# Patient Record
Sex: Male | Born: 1985 | Marital: Single | Smoking: Never smoker
Health system: Southern US, Community
[De-identification: ages and names within clinical notes are randomized; demographics above are authoritative.]

## PROBLEM LIST (undated history)

## (undated) DIAGNOSIS — Z789 Other specified health status: Secondary | ICD-10-CM

## (undated) HISTORY — DX: Other specified health status: Z78.9

## (undated) HISTORY — PX: NO PAST SURGERIES: SHX2092

## (undated) HISTORY — PX: WISDOM TOOTH EXTRACTION: SHX21

---

## 2014-02-07 ENCOUNTER — Encounter (INDEPENDENT_AMBULATORY_CARE_PROVIDER_SITE_OTHER): Payer: Self-pay

## 2014-02-07 ENCOUNTER — Encounter: Payer: Self-pay | Admitting: Family Medicine

## 2014-02-07 ENCOUNTER — Ambulatory Visit (INDEPENDENT_AMBULATORY_CARE_PROVIDER_SITE_OTHER): Payer: BC Managed Care – PPO | Admitting: Family Medicine

## 2014-02-07 VITALS — BP 127/73 | HR 80 | Ht 71.0 in | Wt 160.0 lb

## 2014-02-07 DIAGNOSIS — M542 Cervicalgia: Secondary | ICD-10-CM

## 2014-02-07 NOTE — Patient Instructions (Signed)
You have a cervical strain. Consider tylenol or aleve as needed for pain. Consider muscle relaxant. Simple range of motion exercises within limits of pain to prevent further stiffness. See handout for specific exercises you can do - hold stretches for 20-30 seconds when you do them. Consider physical therapy for stretching, exercises, traction, and modalities - call us if you want to do this at some point. Heat 15 minutes at a time 3-4 times a day to help with spasms. Watch head position when on computers, texting, when sleeping in bed - should in line with back to prevent further muscle strain/spasms. Consider home traction unit if you get benefit with this in physical therapy.  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 Hip side raises 3 x 10 once a day - add weights if this becomes too easy.  Stretches - pick 2 and hold for 20-30 seconds x 3 - do once or twice a day. Tylenol and/or aleve as needed for pain. If not improving, can consider formal physical therapy and/or steroid injection. Follow up with me in 1 month or as needed.

## 2014-02-08 ENCOUNTER — Encounter: Payer: Self-pay | Admitting: Family Medicine

## 2014-02-08 DIAGNOSIS — M542 Cervicalgia: Secondary | ICD-10-CM | POA: Insufficient documentation

## 2014-02-08 NOTE — Assessment & Plan Note (Signed)
2/2 cervical strain.  Reviewed home exercises to do daily.  Declined physical therapy for now - advised to call us if he would like to do this.  Consider tylenol, nsaids, muscle relaxant.  Heat for spasms.  Ergonomic issues discussed.  F/u in 1 month or prn.

## 2014-02-08 NOTE — Progress Notes (Signed)
PCP: No primary care provider on file.  Subjective:   HPI: Patient is a 28 y.o. male here for neck pain.  Patient reports about 2 months ago he woke up with pain in left side of neck. Has improved but then will worsen again. No radiation into extremities. No numbness or tingling. Worse when neck is flexed. No bowel/bladder dysfunction. Tried some stretches at home only.  No past medical history on file.  No current outpatient prescriptions on file prior to visit.   No current facility-administered medications on file prior to visit.    No past surgical history on file.  No Known Allergies  History   Social History  . Marital Status: Single    Spouse Name: N/A    Number of Children: N/A  . Years of Education: N/A   Occupational History  . Not on file.   Social History Main Topics  . Smoking status: Never Smoker   . Smokeless tobacco: Not on file  . Alcohol Use: Not on file  . Drug Use: Not on file  . Sexual Activity: Not on file   Other Topics Concern  . Not on file   Social History Narrative  . No narrative on file    No family history on file.  BP 127/73 mmHg  Pulse 80  Ht 5\' 11"  (1.803 m)  Wt 160 lb (72.576 kg)  BMI 22.33 kg/m2  Review of Systems: See HPI above.    Objective:  Physical Exam:  Gen: NAD  Neck: No gross deformity, swelling, bruising. TTP left trapezius, cervical paraspinal region.  No midline/bony TTP. FROM neck - pain with bilateral lateral rotations in left paraspinal muscles. BUE strength 5/5.   Sensation intact to light touch.   2+ equal reflexes in triceps, biceps, brachioradialis tendons. Negative spurlings. NV intact distal BUEs.    Assessment & Plan:  1. Neck pain - 2/2 cervical strain.  Reviewed home exercises to do daily.  Declined physical therapy for now - advised to call us if he would like to do this.  Consider tylenol, nsaids, muscle relaxant.  Heat for spasms.  Ergonomic issues discussed.  F/u in 1 month or  prn.  Note: he also asked about lateral knee pain, pointed to distal IT band - worse with squatting.  Reviewed home exercises, stretches for this as well.

## 2017-04-24 DIAGNOSIS — M71572 Other bursitis, not elsewhere classified, left ankle and foot: Secondary | ICD-10-CM | POA: Diagnosis not present

## 2017-04-24 DIAGNOSIS — M722 Plantar fascial fibromatosis: Secondary | ICD-10-CM | POA: Diagnosis not present

## 2017-04-24 DIAGNOSIS — M7732 Calcaneal spur, left foot: Secondary | ICD-10-CM | POA: Diagnosis not present

## 2017-05-13 DIAGNOSIS — M722 Plantar fascial fibromatosis: Secondary | ICD-10-CM | POA: Diagnosis not present

## 2017-05-13 DIAGNOSIS — M71572 Other bursitis, not elsewhere classified, left ankle and foot: Secondary | ICD-10-CM | POA: Diagnosis not present

## 2018-04-22 DIAGNOSIS — G4484 Primary exertional headache: Secondary | ICD-10-CM | POA: Diagnosis not present

## 2018-04-22 DIAGNOSIS — Z0000 Encounter for general adult medical examination without abnormal findings: Secondary | ICD-10-CM | POA: Diagnosis not present

## 2018-04-22 DIAGNOSIS — E65 Localized adiposity: Secondary | ICD-10-CM | POA: Diagnosis not present

## 2018-04-22 DIAGNOSIS — M25511 Pain in right shoulder: Secondary | ICD-10-CM | POA: Diagnosis not present

## 2018-04-22 DIAGNOSIS — E663 Overweight: Secondary | ICD-10-CM | POA: Diagnosis not present

## 2018-06-09 ENCOUNTER — Ambulatory Visit: Payer: BLUE CROSS/BLUE SHIELD | Admitting: Family Medicine

## 2018-06-09 ENCOUNTER — Other Ambulatory Visit: Payer: Self-pay

## 2018-06-09 ENCOUNTER — Encounter: Payer: Self-pay | Admitting: Family Medicine

## 2018-06-09 VITALS — BP 129/82 | HR 69 | Ht 71.0 in | Wt 170.0 lb

## 2018-06-09 DIAGNOSIS — M25511 Pain in right shoulder: Secondary | ICD-10-CM

## 2018-06-09 DIAGNOSIS — G8929 Other chronic pain: Secondary | ICD-10-CM | POA: Diagnosis not present

## 2018-06-09 DIAGNOSIS — M25571 Pain in right ankle and joints of right foot: Secondary | ICD-10-CM | POA: Diagnosis not present

## 2018-06-09 NOTE — Patient Instructions (Addendum)
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 Ice the area 15 minutes at a time 3-4 times a day. Naproxen twice a day with food (when you run out of this you can take 2 aleve twice a day with food instead). Arch support is important (your orthotics) Avoid flat shoes, barefoot walking as much as possible.  I'm concerned you've at least bruised the labrum of your shoulder Try to avoid painful activities (overhead activities, spiking) as much as possible for the next 6 weeks. Naproxen for this as well. Do home exercise program with theraband and scapular stabilization exercises daily 3 sets of 10 once a day. If not improving at follow-up we will consider MR arthrogram Follow up with me in 6 weeks.

## 2018-06-09 NOTE — Progress Notes (Signed)
PCP: [facility]  Subjective:   HPI: Patient is a 33 y.o. male here for right foot, right shoulder pain.  Patient reports he's had about 4 days of right anterolateral ankle and lateral foot pain. No acute injury but restarted working out - ran 3 miles, next day exercised and played volleyball, woke up day after this with current pain. Associated swelling. Tried massage, epsom salt baths, naproxen. Some improvement though pain is 4-5/10 and sharp at worst. Also with about 6 months of posterolateral right shoulder pain. Pain 0/10 at rest but when spiking volleyball comes on to 8/10 level, sharp. No skin changes, numbness. No acute injury  History reviewed. No pertinent past medical history.  Current Outpatient Medications on File Prior to Visit  Medication Sig Dispense Refill  . naproxen (NAPROSYN) 500 MG tablet      No current facility-administered medications on file prior to visit.     Past Surgical History:  Procedure Laterality Date  . WISDOM TOOTH EXTRACTION      No Known Allergies  Social History   Socioeconomic History  . Marital status: Single    Spouse name: Not on file  . Number of children: Not on file  . Years of education: Not on file  . Highest education level: Not on file  Occupational History  . Not on file  Social Needs  . Financial resource strain: Not on file  . Food insecurity:    Worry: Not on file    Inability: Not on file  . Transportation needs:    Medical: Not on file    Non-medical: Not on file  Tobacco Use  . Smoking status: Never Smoker  . Smokeless tobacco: Never Used  Substance and Sexual Activity  . Alcohol use: Not on file  . Drug use: Not on file  . Sexual activity: Not on file  Lifestyle  . Physical activity:    Days per week: Not on file    Minutes per session: Not on file  . Stress: Not on file  Relationships  . Social connections:    Talks on phone: Not on file    Gets together: Not on  file    Attends religious service: Not on file    Active member of club or organization: Not on file    Attends meetings of clubs or organizations: Not on file    Relationship status: Not on file  . Intimate partner violence:    Fear of current or ex partner: Not on file    Emotionally abused: Not on file    Physically abused: Not on file    Forced sexual activity: Not on file  Other Topics Concern  . Not on file  Social History Narrative  . Not on file    History reviewed. No pertinent family history.  BP 129/82   Pulse 69   Ht 5\' 11"  (1.803 m)   Wt 170 lb (77.1 kg)   BMI 23.71 kg/m   Review of Systems: See HPI above.     Objective:  Physical Exam:  Gen: NAD, comfortable in exam room  Right foot/ankle: No gross deformity, swelling, ecchymoses FROM with 5/5 strength without pain. TTP sinus tarsi.  No other tenderness. Negative ant drawer and talar tilt.   Negative syndesmotic compression. Thompsons test negative. NV intact distally. Negative hop test.  Left foot/ankle: No deformity. FROM with 5/5 strength. No tenderness to palpation. NVI distally.  Right shoulder: No swelling, ecchymoses.  No gross deformity.  No TTP. FROM. Negative Hawkins, Neers. Negative Yergasons. Strength 5/5 with empty can and resisted internal/external rotation. Negative apprehension. Positive o'briens. NV intact distally.  Left shoulder: No swelling, ecchymoses.  No gross deformity. No TTP. FROM. Strength 5/5 with empty can and resisted internal/external rotation. NV intact distally.   Assessment & Plan:  1. Right foot/ankle pain - consistent with sinus tarsi syndrome.  Reassured patient.  Icing, naproxen, stressed importance of arch supports and no flat shoes/barefoot walking.  Activities as tolerated otherwise.  2. Right shoulder pain - concerning for labral pathology.  Has not had any rehab for this - reviewed home exercise program today.  Avoid spiking volleyball in  next 6 weeks.  Naproxen.  Consider MR arthrogram, physical therapy.  F/u in 6 weeks.

## 2018-09-13 ENCOUNTER — Ambulatory Visit (INDEPENDENT_AMBULATORY_CARE_PROVIDER_SITE_OTHER): Payer: BC Managed Care – PPO | Admitting: Sports Medicine

## 2018-09-13 ENCOUNTER — Encounter: Payer: Self-pay | Admitting: Sports Medicine

## 2018-09-13 ENCOUNTER — Ambulatory Visit (INDEPENDENT_AMBULATORY_CARE_PROVIDER_SITE_OTHER): Payer: BC Managed Care – PPO

## 2018-09-13 ENCOUNTER — Other Ambulatory Visit: Payer: Self-pay

## 2018-09-13 DIAGNOSIS — G8929 Other chronic pain: Secondary | ICD-10-CM

## 2018-09-13 DIAGNOSIS — M25511 Pain in right shoulder: Secondary | ICD-10-CM

## 2018-09-13 DIAGNOSIS — S43431A Superior glenoid labrum lesion of right shoulder, initial encounter: Secondary | ICD-10-CM | POA: Diagnosis not present

## 2018-09-13 MED ORDER — MELOXICAM 15 MG PO TABS: ORAL_TABLET | ORAL | 3 refills | Status: DC

## 2018-09-13 NOTE — Assessment & Plan Note (Addendum)
9 months of pain referrable to the labrum. Has failed greater than 6 weeks of physician directed physical therapy. X-rays today. Persistent discomfort, adding an MR arthrogram, he will return this afternoon at 4:15 for the arthrogram injection and he will have his MRI at 4:45.

## 2018-09-13 NOTE — Progress Notes (Signed)
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  I reviewed the past medical history, family history, social history, surgical history, and allergies today and no changes were needed.  Please see the problem list section below in epic for further details.  Past Medical History: Past Medical History:  Diagnosis Date  . No pertinent past medical history    Past Surgical History: Past Surgical History:  Procedure Laterality Date  . NO PAST SURGERIES    . WISDOM TOOTH EXTRACTION     Social History: Social History   Socioeconomic History  . Marital status: Single    Spouse name: Not on file  . Number of children: Not on file  . Years of education: Not on file  . Highest education level: Not on file  Occupational History  . Not on file  Social Needs  . Financial resource strain: Not on file  . Food insecurity    Worry: Not on file    Inability: Not on file  . Transportation needs    Medical: Not on file    Non-medical: Not on file  Tobacco Use  . Smoking status: Never Smoker  . Smokeless tobacco: Never Used  Substance and Sexual Activity  . Alcohol use: Yes    Alcohol/week: 0.0 standard drinks  . Drug use: Never  . Sexual activity: Not on file  Lifestyle  . Physical activity    Days per week: Not on file    Minutes per session: Not on file  . Stress: Not on file  Relationships  . Social Herbalist on phone: Not on file    Gets together: Not on file    Attends religious service: Not on file    Active member of club or organization: Not on file    Attends meetings of clubs or organizations: Not on file    Relationship status: Not on file  Other  Topics Concern  . Not on file  Social History Narrative  . Not on file   Family History: No family history on file. Allergies: No Known Allergies Medications: See med rec.  Review of Systems: No headache, visual changes, nausea, vomiting, diarrhea, constipation, dizziness, abdominal pain, skin rash, fevers, chills, night sweats, swollen lymph nodes, weight loss, chest pain, body aches, joint swelling, muscle aches, shortness of breath, mood changes, visual or auditory hallucinations.  Objective:    General: Well Developed, well nourished, and in no acute distress.  Neuro: Alert and oriented x3, extra-ocular muscles intact, sensation grossly intact.  HEENT: Normocephalic, atraumatic, pupils equal round reactive to light, neck supple, no masses, no lymphadenopathy, thyroid nonpalpable.  Skin: Warm and dry, no rashes noted.  Cardiac: Regular rate and rhythm, no murmurs rubs or gallops.  Respiratory: Clear to auscultation bilaterally. Not using accessory muscles, speaking in full sentences.  Abdominal: Soft, nontender, nondistended, positive bowel sounds, no masses, no organomegaly.  Right shoulder: Inspection reveals no abnormalities, atrophy or asymmetry. Palpation is normal with no tenderness over AC joint or bicipital groove. ROM is full in all planes. Rotator cuff strength normal throughout. No signs of impingement with negative Neer and Hawkin's tests, empty can. Speeds and Yergason's tests normal. Positive  Obrien's, negative crank, negative clunk, and good stability. Normal scapular function observed. No painful arc and no drop arm sign. No apprehension sign  Impression and Recommendations:    The patient was counselled, risk factors were discussed, anticipatory guidance given.  Right shoulder pain 9 months of pain referrable to the labrum. Has failed greater than 6 weeks of physician directed physical therapy. X-rays today. Persistent discomfort, adding an MR arthrogram,  he will return this afternoon at 4:15 for the arthrogram injection and he will have his MRI at 4:45.   ___________________________________________ Ihor Austinhomas J. Benjamin Stainhekkekandam, M.D., ABFM., CAQSM. Primary Care and Sports Medicine [facility]  Adjunct Professor of Family Medicine  [employer]

## 2018-09-13 NOTE — Progress Notes (Signed)
   Procedure: Real-time Ultrasound Guided gadolinium contrast injection of right glenohumeral joint Device: GE Logiq E  Verbal informed consent obtained.  Time-out conducted.  Noted no overlying erythema, induration, or other signs of local infection.  Skin prepped in a sterile fashion.  Local anesthesia: Topical Ethyl chloride.  With sterile technique and under real time ultrasound guidance: I guided a 22-gauge spinal needle into the glenohumeral joint from a posterior approach, I then injected 1 cc Kenalog 40, 2 cc lidocaine, 2 cc bupivacaine, syringe switched and 0.1 cc gadolinium injected, syringe again switched and 12 cc sterile saline used to flush the needle and distend the joint. Joint visualized and capsule seen distending confirming intra-articular placement of contrast material and medication. Completed without difficulty  Advised to call if fevers/chills, erythema, induration, drainage, or persistent bleeding.  Images permanently stored and available for review in the ultrasound unit.  Impression: Technically successful ultrasound guided gadolinium contrast injection for MR arthrography.  Please see separate MR arthrogram report.

## 2018-10-21 DIAGNOSIS — Z1159 Encounter for screening for other viral diseases: Secondary | ICD-10-CM | POA: Diagnosis not present

## 2018-10-25 DIAGNOSIS — U071 COVID-19: Secondary | ICD-10-CM | POA: Diagnosis not present

## 2019-03-10 ENCOUNTER — Ambulatory Visit: Payer: BC Managed Care – PPO | Attending: Internal Medicine

## 2019-03-10 DIAGNOSIS — Z20822 Contact with and (suspected) exposure to covid-19: Secondary | ICD-10-CM

## 2019-03-10 DIAGNOSIS — Z20828 Contact with and (suspected) exposure to other viral communicable diseases: Secondary | ICD-10-CM | POA: Diagnosis not present

## 2019-03-11 LAB — NOVEL CORONAVIRUS, NAA: SARS-CoV-2, NAA: NOT DETECTED

## 2019-04-15 DIAGNOSIS — Z7189 Other specified counseling: Secondary | ICD-10-CM | POA: Diagnosis not present

## 2019-04-15 DIAGNOSIS — Z20828 Contact with and (suspected) exposure to other viral communicable diseases: Secondary | ICD-10-CM | POA: Diagnosis not present

## 2019-07-06 DIAGNOSIS — Z114 Encounter for screening for human immunodeficiency virus [HIV]: Secondary | ICD-10-CM | POA: Diagnosis not present

## 2019-07-06 DIAGNOSIS — Z1159 Encounter for screening for other viral diseases: Secondary | ICD-10-CM | POA: Diagnosis not present

## 2019-07-06 DIAGNOSIS — Z3141 Encounter for fertility testing: Secondary | ICD-10-CM | POA: Diagnosis not present

## 2019-07-06 DIAGNOSIS — Z113 Encounter for screening for infections with a predominantly sexual mode of transmission: Secondary | ICD-10-CM | POA: Diagnosis not present

## 2019-08-08 DIAGNOSIS — Z3189 Encounter for other procreative management: Secondary | ICD-10-CM | POA: Diagnosis not present

## 2019-11-29 DIAGNOSIS — Z03818 Encounter for observation for suspected exposure to other biological agents ruled out: Secondary | ICD-10-CM | POA: Diagnosis not present

## 2020-01-02 DIAGNOSIS — Z130 Encounter for screening for diseases of the blood and blood-forming organs and certain disorders involving the immune mechanism: Secondary | ICD-10-CM | POA: Diagnosis not present

## 2020-01-02 DIAGNOSIS — M67819 Other specified disorders of synovium and tendon, unspecified shoulder: Secondary | ICD-10-CM | POA: Diagnosis not present

## 2020-01-02 DIAGNOSIS — Z131 Encounter for screening for diabetes mellitus: Secondary | ICD-10-CM | POA: Diagnosis not present

## 2020-01-02 DIAGNOSIS — Z0000 Encounter for general adult medical examination without abnormal findings: Secondary | ICD-10-CM | POA: Diagnosis not present

## 2020-01-02 DIAGNOSIS — Z1329 Encounter for screening for other suspected endocrine disorder: Secondary | ICD-10-CM | POA: Diagnosis not present

## 2020-01-02 DIAGNOSIS — Z1321 Encounter for screening for nutritional disorder: Secondary | ICD-10-CM | POA: Diagnosis not present

## 2020-01-02 DIAGNOSIS — Z23 Encounter for immunization: Secondary | ICD-10-CM | POA: Diagnosis not present

## 2020-01-02 DIAGNOSIS — Z13220 Encounter for screening for lipoid disorders: Secondary | ICD-10-CM | POA: Diagnosis not present

## 2020-02-09 DIAGNOSIS — Z31440 Encounter of male for testing for genetic disease carrier status for procreative management: Secondary | ICD-10-CM | POA: Diagnosis not present

## 2020-02-09 DIAGNOSIS — Z315 Encounter for genetic counseling: Secondary | ICD-10-CM | POA: Diagnosis not present

## 2021-01-28 ENCOUNTER — Other Ambulatory Visit: Payer: Self-pay

## 2021-01-28 ENCOUNTER — Encounter: Payer: Self-pay | Admitting: Family Medicine

## 2021-01-28 ENCOUNTER — Ambulatory Visit (HOSPITAL_BASED_OUTPATIENT_CLINIC_OR_DEPARTMENT_OTHER)
Admission: RE | Admit: 2021-01-28 | Discharge: 2021-01-28 | Disposition: A | Discharge status: Home / Self Care | Payer: BC Managed Care – PPO | Source: Ambulatory Visit | Attending: Family Medicine | Admitting: Family Medicine

## 2021-01-28 ENCOUNTER — Ambulatory Visit: Payer: Self-pay

## 2021-01-28 ENCOUNTER — Ambulatory Visit: Payer: BC Managed Care – PPO | Admitting: Family Medicine

## 2021-01-28 VITALS — BP 106/74 | Ht 71.0 in | Wt 175.0 lb

## 2021-01-28 DIAGNOSIS — S63642A Sprain of metacarpophalangeal joint of left thumb, initial encounter: Secondary | ICD-10-CM | POA: Insufficient documentation

## 2021-01-28 DIAGNOSIS — S62515A Nondisplaced fracture of proximal phalanx of left thumb, initial encounter for closed fracture: Secondary | ICD-10-CM | POA: Diagnosis present

## 2021-01-28 DIAGNOSIS — S39012A Strain of muscle, fascia and tendon of lower back, initial encounter: Secondary | ICD-10-CM | POA: Insufficient documentation

## 2021-01-28 DIAGNOSIS — M79645 Pain in left finger(s): Secondary | ICD-10-CM

## 2021-01-28 NOTE — Progress Notes (Signed)
  Ian Webb - 35 y.o. male MRN [id number]  Date of birth: 1986/01/19  SUBJECTIVE:  Including CC & ROS.  No chief complaint on file.   Ian Webb is a 35 y.o. male that is presenting with left thumb pain and low back pain.  Thumb pain has been ongoing for about 1 month.  His thumb was hit while playing basketball.  Since that time he is continue to have pain at the MP joint.  Also having mid back pain that is right-sided with no radicular component.   Review of Systems See HPI   HISTORY: Past Medical, Surgical, Social, and Family History Reviewed & Updated per EMR.   Pertinent Historical Findings include:  Past Medical History:  Diagnosis Date   No pertinent past medical history     Past Surgical History:  Procedure Laterality Date   NO PAST SURGERIES     WISDOM TOOTH EXTRACTION      History reviewed. No pertinent family history.  Social History   Socioeconomic History   Marital status: Single    Spouse name: Not on file   Number of children: Not on file   Years of education: Not on file   Highest education level: Not on file  Occupational History   Not on file  Tobacco Use   Smoking status: Never   Smokeless tobacco: Never  Substance and Sexual Activity   Alcohol use: Yes    Alcohol/week: 0.0 standard drinks   Drug use: Never   Sexual activity: Not on file  Other Topics Concern   Not on file  Social History Narrative   Not on file   Social Determinants of Health   Financial Resource Strain: Not on file  Food Insecurity: Not on file  Transportation Needs: Not on file  Physical Activity: Not on file  Stress: Not on file  Social Connections: Not on file  Intimate Partner Violence: Not on file     PHYSICAL EXAM:  VS: BP 106/74 (BP Location: Left Arm, Patient Position: Sitting)   Ht 5\' 11"  (1.803 m)   Wt 175 lb (79.4 kg)   BMI 24.41 kg/m  Physical Exam Gen: NAD, alert, cooperative with exam, well-appearing   Limited ultrasound: Left thumb:  There  appears to be a partial tear of the UCL ligament. No effusion of the [facility] joint. No effusion of the MP joint. There is a cortical change of the proximal metacarpal bone with increased hyperemia  Summary: UCL tear with concern for fracture of the thumb metacarpal  Ultrasound and interpretation by [facility], MD     ASSESSMENT & PLAN:   Rupture of ulnar collateral ligament of left thumb Initial injury was around 10/7.  Appears to be a partial tear some laxity. -Counseled on home exercise therapy and supportive care. -Thumb spica brace. -May need to consider further imaging.  Closed nondisplaced fracture of proximal phalanx of left thumb Initial injury around 10/7.  There does appear to be a cortical change of the metacarpal to suggest a fracture. -Counseled on home exercise therapy and supportive care. -X-ray. -May consider further imaging.   Strain of lumbar region He feels tight in the lower portion with no radicular component. -Counseled on home exercise therapy and supportive care. -Could consider physical therapy.

## 2021-01-28 NOTE — Assessment & Plan Note (Signed)
Initial injury around 10/7.  There does appear to be a cortical change of the metacarpal to suggest a fracture. -Counseled on home exercise therapy and supportive care. -X-ray. -May consider further imaging.

## 2021-01-28 NOTE — Assessment & Plan Note (Signed)
Initial injury was around 10/7.  Appears to be a partial tear some laxity. -Counseled on home exercise therapy and supportive care. -Thumb spica brace. -May need to consider further imaging.

## 2021-01-28 NOTE — Assessment & Plan Note (Signed)
He feels tight in the lower portion with no radicular component. -Counseled on home exercise therapy and supportive care. -Could consider physical therapy.

## 2021-01-28 NOTE — Patient Instructions (Signed)
Nice to meet you  Please try the brace  I will call with the results from today  Please send me a message in MyChart with any questions or updates.  Please see me back in 2 weeks.   --Dr. Jordan Likes

## 2021-01-29 ENCOUNTER — Telehealth: Payer: Self-pay | Admitting: Family Medicine

## 2021-01-29 NOTE — Telephone Encounter (Signed)
Left VM for patient. If he calls back please have him speak with a nurse/CMA and inform that the xray confirms the fracture we observed on ultrasound. Will continue with plan.   If any questions then please take the best time and phone number to call and I will try to call him back.   Myra Rude, MD [facility] 01/29/2021, 8:46 AM

## 2021-01-29 NOTE — Telephone Encounter (Signed)
Left message for patient to call back  

## 2021-01-29 NOTE — Telephone Encounter (Signed)
Pt informed of below.  

## 2021-02-07 ENCOUNTER — Encounter: Payer: Self-pay | Admitting: Family Medicine

## 2021-02-11 ENCOUNTER — Ambulatory Visit: Payer: BC Managed Care – PPO | Admitting: Family Medicine

## 2021-03-21 IMAGING — MR MRI OF THE RIGHT SHOULDER WITH CONTRAST
7 series · 40 of 40 positions shown · IV contrast (agent unspecified)
Comparison: Right shoulder x-rays from same day.

CLINICAL DATA: Chronic right shoulder pain.

EXAM:
MR ARTHROGRAM OF THE RIGHT SHOULDER
TECHNIQUE: Multiplanar, multisequence MR imaging of the right shoulder was
performed following the administration of intra-articular contrast.
CONTRAST:  See Injection Documentation.

[Series 6: T1 fat-sat · axial · 4.0mm · 0.47mm/px · z∈[-35,+64]mm · 7 of 24 slices shown (1 of 5)]
[im 1/24]
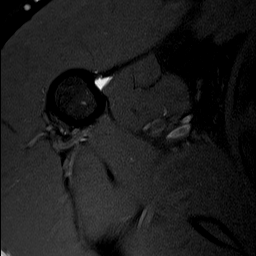
[im 4/24]
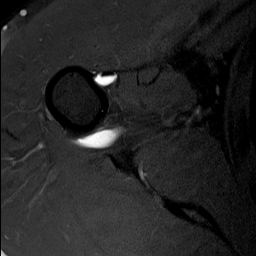
[im 8/24]
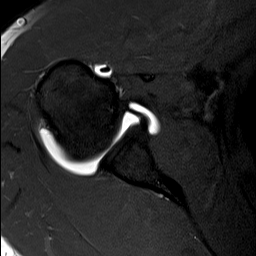
[im 12/24]
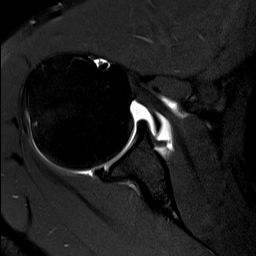
[im 16/24]
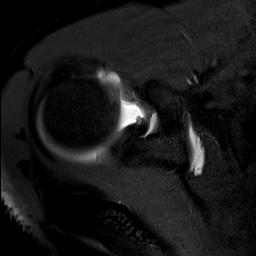
[im 20/24]
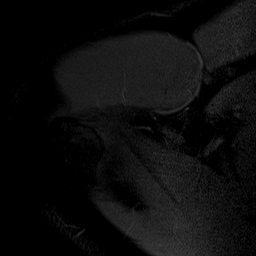
[im 24/24]
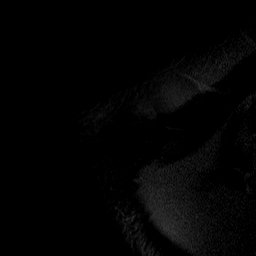

[Series 7: T1 fat-sat · oblique · 4.0mm · 0.55mm/px · 5 of 20 slices shown (2 of 5)]
[im 1/20]
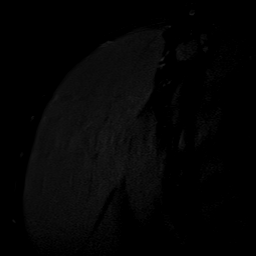
[im 5/20]
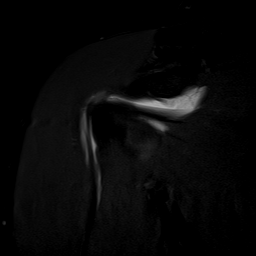
[im 10/20]
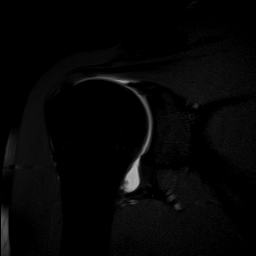
[im 15/20]
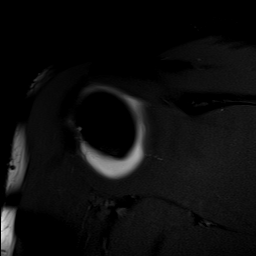
[im 20/20]
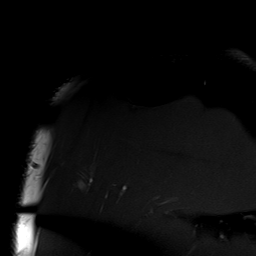

[Series 8: T2 fat-sat · oblique · 4.0mm · 0.55mm/px · 5 of 20 slices shown (1 of 2)]
[im 1/20]
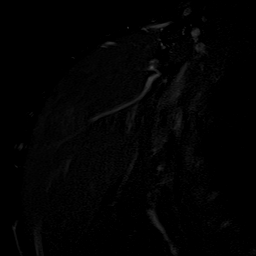
[im 5/20]
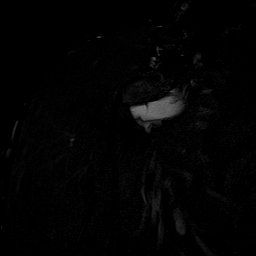
[im 10/20]
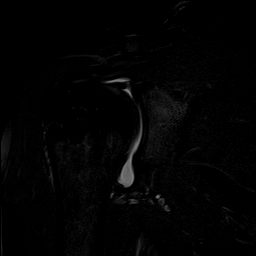
[im 15/20]
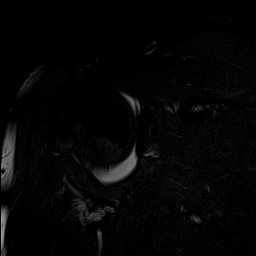
[im 20/20]
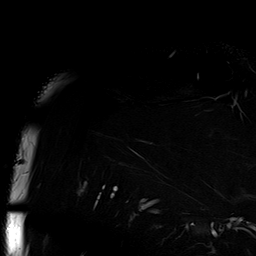

[Series 9: T1 fat-sat · oblique · non-contrast · 4.0mm · 0.44mm/px · 5 of 20 slices shown (3 of 5)]
[im 1/20]
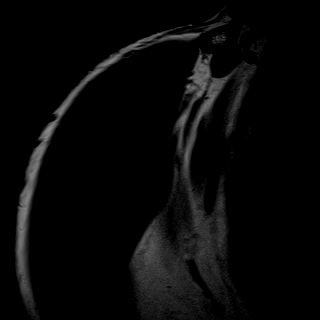
[im 5/20]
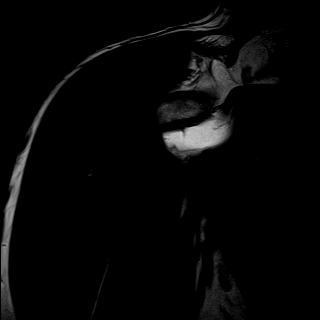
[im 10/20]
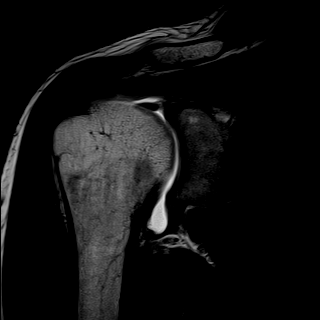
[im 15/20]
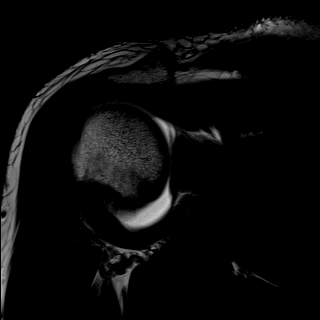
[im 20/20]
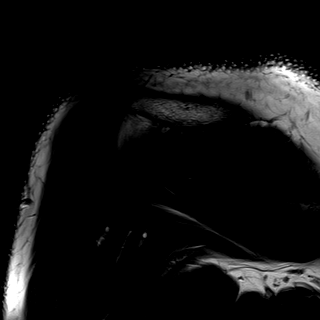

[Series 10: T2 fat-sat · oblique · 4.0mm · 0.55mm/px · 7 of 25 slices shown (2 of 2)]
[im 1/25]
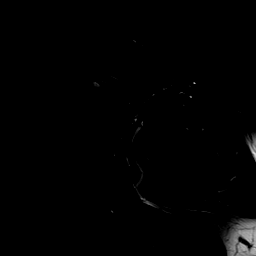
[im 5/25]
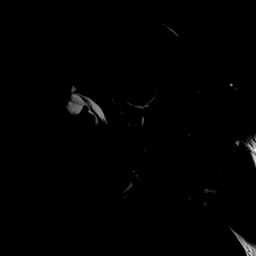
[im 9/25]
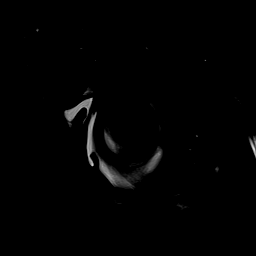
[im 13/25]
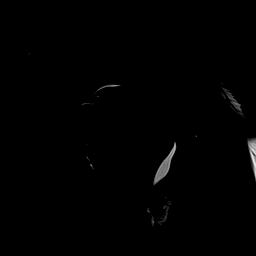
[im 17/25]
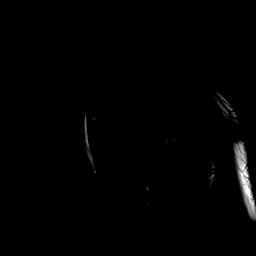
[im 21/25]
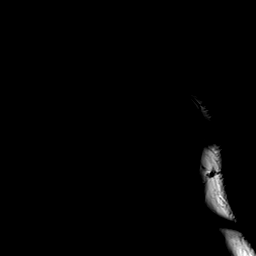
[im 25/25]
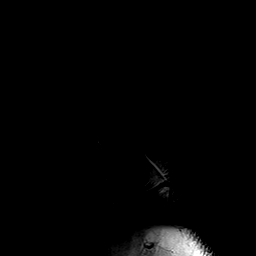

[Series 11: T1 fat-sat · axial · 4.0mm · 0.47mm/px · z∈[-33,+66]mm · 6 of 24 slices shown (4 of 5)]
[im 1/24]
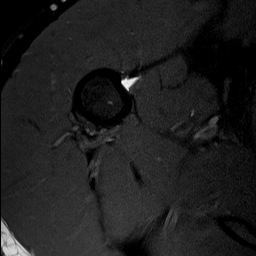
[im 5/24]
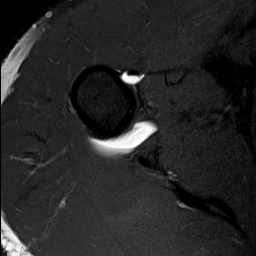
[im 10/24]
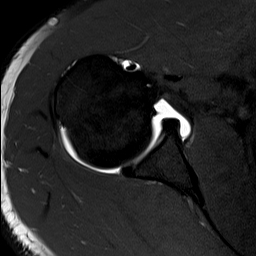
[im 14/24]
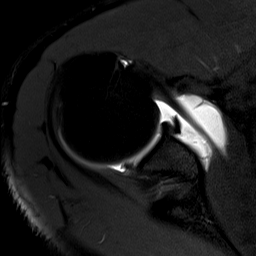
[im 19/24]
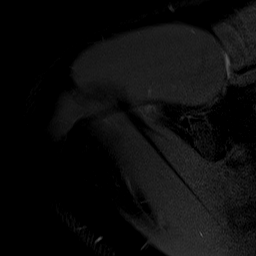
[im 24/24]
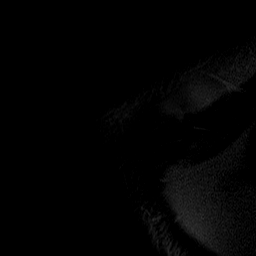

[Series 20: T1 fat-sat · oblique · 4.0mm · 0.55mm/px · 5 of 18 slices shown (5 of 5)]
[im 1/18]
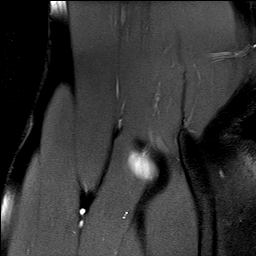
[im 5/18]
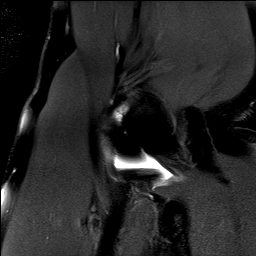
[im 9/18]
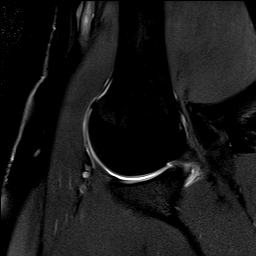
[im 13/18]
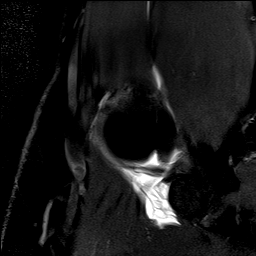
[im 18/18]
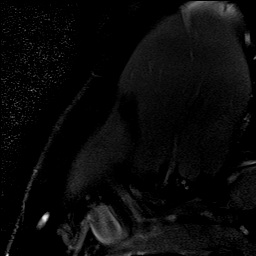

[40 of 40 positions shown; findings below may reference images not displayed]

FINDINGS: Rotator cuff:  Intact rotator cuff.  Mild supraspinatus tendinosis.

Muscles:  No focal muscular atrophy or edema.

Biceps long head:  Intact and normally positioned.

Acromioclavicular Joint: The acromion is type I. Normal
acromioclavicular joint. No significant fluid or contrast is present
in the subacromial/subdeltoid bursa.

Glenohumeral Joint: Distended with intra-articular contrast. No
chondral defect.

Labrum: Thickening of and focal irregular signal undercutting the
posterior labrum. This may also involve the adjacent glenoid
cartilage.

Bones: No acute or significant extra-articular osseous findings.

Other: No significant soft tissue findings.
IMPRESSION: 1. Small posterior labral tear with possible disruption of the
adjacent glenoid cartilage.
2. Mild supraspinatus tendinosis.  Intact rotator cuff.

## 2022-07-07 ENCOUNTER — Encounter: Payer: Self-pay | Admitting: *Deleted

## 2023-05-06 ENCOUNTER — Ambulatory Visit: Payer: BC Managed Care – PPO

## 2023-05-06 ENCOUNTER — Encounter: Payer: Self-pay | Admitting: Sports Medicine

## 2023-05-06 ENCOUNTER — Ambulatory Visit: Payer: BC Managed Care – PPO | Admitting: Sports Medicine

## 2023-05-06 DIAGNOSIS — G8929 Other chronic pain: Secondary | ICD-10-CM

## 2023-05-06 DIAGNOSIS — M25511 Pain in right shoulder: Secondary | ICD-10-CM | POA: Diagnosis not present

## 2023-05-06 DIAGNOSIS — M222X2 Patellofemoral disorders, left knee: Secondary | ICD-10-CM | POA: Diagnosis not present

## 2023-05-06 DIAGNOSIS — M222X1 Patellofemoral disorders, right knee: Secondary | ICD-10-CM

## 2023-05-06 DIAGNOSIS — M25562 Pain in left knee: Secondary | ICD-10-CM | POA: Diagnosis not present

## 2023-05-06 DIAGNOSIS — M25561 Pain in right knee: Secondary | ICD-10-CM

## 2023-05-06 MED ORDER — MELOXICAM 15 MG PO TABS: ORAL_TABLET | ORAL | 3 refills | Status: AC

## 2023-05-06 NOTE — Assessment & Plan Note (Signed)
Historically had pain referable to the labrum back in 2020, has been doing a lot of chest workout, currently has pain anteriorly, shoulder exam is normal with the exception of tenderness over the pec tendon distally I think he has a pec tendinitis, he can work on this in PT.

## 2023-05-06 NOTE — Progress Notes (Signed)
    Procedures performed today:    None.  Independent interpretation of notes and tests performed by another provider:   None.  Brief History, Exam, Impression, and Recommendations:    Patellofemoral syndrome, bilateral Pleasant previously healthy 38 year old male, he has been having increasing pain both knees anterior aspect worse with flexion type activities. On exam he has weakness to hip abductors bilaterally, he also has dynamic genu valgus with pes planus. The rest of his exam was normal. He was informed of the pathophysiology of this disease process, we will get x-rays, meloxicam, aggressive formal PT. Custom orthotics due to dynamic pronation. Return to see me about 6 weeks. MRI if not better.   Right shoulder pain Historically had pain referable to the labrum back in 2020, has been doing a lot of chest workout, currently has pain anteriorly, shoulder exam is normal with the exception of tenderness over the pec tendon distally I think he has a pec tendinitis, he can work on this in PT.    ____________________________________________ Ian Webb. Benjamin Stain, M.D., ABFM., CAQSM., AME. Primary Care and Sports Medicine [facility]  Adjunct Professor of Family Medicine  [employer]  Restaurant manager, fast food

## 2023-05-06 NOTE — Assessment & Plan Note (Signed)
Pleasant previously healthy 38 year old male, he has been having increasing pain both knees anterior aspect worse with flexion type activities. On exam he has weakness to hip abductors bilaterally, he also has dynamic genu valgus with pes planus. The rest of his exam was normal. He was informed of the pathophysiology of this disease process, we will get x-rays, meloxicam, aggressive formal PT. Custom orthotics due to dynamic pronation. Return to see me about 6 weeks. MRI if not better.

## 2023-05-13 ENCOUNTER — Ambulatory Visit: Payer: BC Managed Care – PPO | Admitting: Family Medicine

## 2023-05-13 ENCOUNTER — Encounter: Payer: BC Managed Care – PPO | Admitting: Sports Medicine

## 2023-05-13 ENCOUNTER — Encounter: Payer: Self-pay | Admitting: Family Medicine

## 2023-05-13 VITALS — BP 136/82 | Ht 71.0 in | Wt 170.0 lb

## 2023-05-13 DIAGNOSIS — M2142 Flat foot [pes planus] (acquired), left foot: Secondary | ICD-10-CM | POA: Diagnosis not present

## 2023-05-13 DIAGNOSIS — M2141 Flat foot [pes planus] (acquired), right foot: Secondary | ICD-10-CM | POA: Insufficient documentation

## 2023-05-13 NOTE — Assessment & Plan Note (Signed)
Patient was fitted for a standard, cushioned, semi-rigid orthotic.  The orthotic was heated and the patient stood on the orthotic blank positioned on the orthotic stand. The patient was positioned in subtalar neutral position and 10 degrees of ankle dorsiflexion in a weight bearing stance. After molding, a stable [employer] EVA base was applied to the orthotic blank.   The blank was ground to a stable position for weight bearing. size: 11 base: Blue EVA  posting: none additional orthotic padding: none Orthotics fit well and patients gait: stable with decreased pronation dynamically. Pt reported feeling increased support.

## 2023-05-13 NOTE — Progress Notes (Signed)
  Ian Webb - 38 y.o. male MRN [id number]  Date of birth: 1985-07-05  PCP: [facility]  Subjective:  No chief complaint on file. Need for custom orthotics  HPI: Past Medical, Surgical, Social, and Family History Reviewed & Updated per EMR.   Patient is a 38 y.o. male here for custom orthotics after seeing Dr. Benjamin Webb.  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  He does not have any specific foot pain.   Past Surgical History:  Procedure Laterality Date   NO PAST SURGERIES     WISDOM TOOTH EXTRACTION      No Known Allergies      Objective:  Physical Exam: VS: BP:136/82  HR: bpm  TEMP: ( )  RESP:   HT:5\' 11"  (180.3 cm)   WT:170 lb (77.1 kg)  BMI:23.72  Gen: Well developed, NAD, speaks clearly, comfortable in exam room Respiratory: Normal work of breathing on room air, no respiratory distress Skin: No rashes, abrasions, or ecchymosis MSK:  With weightbearing the ankles have a neutral alignment. There is midfoot pronation and pes planus with near-total breakdown of the longitudinal arch. Transverse arch is maintained There is no splaying of the toes    Assessment & Plan:   Pes planus of both feet Patient was fitted for a standard, cushioned, semi-rigid orthotic.  The orthotic was heated and the patient stood on the orthotic blank positioned on the orthotic stand. The patient was positioned in subtalar neutral position and 10 degrees of ankle dorsiflexion in a weight bearing stance. After molding, a stable [employer] EVA base was applied to the orthotic blank.   The blank was ground to a stable position for weight bearing. size: 11 base: Blue EVA  posting: none additional orthotic padding: none Orthotics fit well and patients gait: stable with decreased pronation dynamically. Pt reported feeling increased support.     Ian Mote MD [facility]  Fellow

## 2023-06-17 ENCOUNTER — Ambulatory Visit: Payer: BC Managed Care – PPO | Admitting: Sports Medicine

## 2023-11-24 ENCOUNTER — Encounter: Payer: Self-pay | Admitting: Sports Medicine
